# Patient Record
Sex: Female | Born: 1960 | Race: Black or African American | Hispanic: No | Smoking: Never smoker
Health system: Southern US, Community
[De-identification: ages and names within clinical notes are randomized; demographics above are authoritative.]

## PROBLEM LIST (undated history)

## (undated) DIAGNOSIS — F32A Depression, unspecified: Secondary | ICD-10-CM

## (undated) DIAGNOSIS — F419 Anxiety disorder, unspecified: Secondary | ICD-10-CM

## (undated) DIAGNOSIS — F329 Major depressive disorder, single episode, unspecified: Secondary | ICD-10-CM

## (undated) HISTORY — DX: Anxiety disorder, unspecified: F41.9

## (undated) HISTORY — DX: Depression, unspecified: F32.A

## (undated) HISTORY — DX: Major depressive disorder, single episode, unspecified: F32.9

---

## 2000-02-13 ENCOUNTER — Encounter: Payer: Self-pay | Admitting: Family Medicine

## 2000-02-13 ENCOUNTER — Ambulatory Visit (HOSPITAL_COMMUNITY): Admission: RE | Admit: 2000-02-13 | Discharge: 2000-02-13 | Payer: Self-pay | Admitting: Family Medicine

## 2001-01-12 ENCOUNTER — Inpatient Hospital Stay (HOSPITAL_COMMUNITY): Admission: RE | Admit: 2001-01-12 | Discharge: 2001-01-14 | Payer: Self-pay | Admitting: Obstetrics and Gynecology

## 2001-01-12 ENCOUNTER — Encounter (INDEPENDENT_AMBULATORY_CARE_PROVIDER_SITE_OTHER): Payer: Self-pay | Admitting: Specialist

## 2002-04-15 ENCOUNTER — Emergency Department (HOSPITAL_COMMUNITY): Admission: EM | Admit: 2002-04-15 | Discharge: 2002-04-15 | Payer: Self-pay | Admitting: Emergency Medicine

## 2003-04-08 ENCOUNTER — Ambulatory Visit (HOSPITAL_COMMUNITY): Admission: RE | Admit: 2003-04-08 | Discharge: 2003-04-08 | Payer: Self-pay | Admitting: Family Medicine

## 2003-08-11 ENCOUNTER — Ambulatory Visit (HOSPITAL_COMMUNITY): Admission: RE | Admit: 2003-08-11 | Discharge: 2003-08-11 | Payer: Self-pay | Admitting: General Surgery

## 2003-08-11 ENCOUNTER — Encounter (INDEPENDENT_AMBULATORY_CARE_PROVIDER_SITE_OTHER): Payer: Self-pay | Admitting: Specialist

## 2003-12-09 ENCOUNTER — Other Ambulatory Visit: Admission: RE | Admit: 2003-12-09 | Discharge: 2003-12-09 | Payer: Self-pay | Admitting: Family Medicine

## 2005-05-08 ENCOUNTER — Other Ambulatory Visit: Admission: RE | Admit: 2005-05-08 | Discharge: 2005-05-08 | Payer: Self-pay | Admitting: Family Medicine

## 2006-01-07 HISTORY — PX: COLOSTOMY REVERSAL: SHX5782

## 2006-01-07 HISTORY — PX: COLOSTOMY: SHX63

## 2006-01-07 HISTORY — PX: BUNIONECTOMY: SHX129

## 2006-06-03 ENCOUNTER — Inpatient Hospital Stay (HOSPITAL_COMMUNITY): Admission: EM | Admit: 2006-06-03 | Discharge: 2006-06-11 | Payer: Self-pay | Admitting: Emergency Medicine

## 2006-06-03 ENCOUNTER — Encounter (INDEPENDENT_AMBULATORY_CARE_PROVIDER_SITE_OTHER): Payer: Self-pay | Admitting: General Surgery

## 2006-09-23 ENCOUNTER — Inpatient Hospital Stay (HOSPITAL_COMMUNITY): Admission: RE | Admit: 2006-09-23 | Discharge: 2006-10-06 | Payer: Self-pay | Admitting: General Surgery

## 2006-09-23 ENCOUNTER — Encounter (INDEPENDENT_AMBULATORY_CARE_PROVIDER_SITE_OTHER): Payer: Self-pay | Admitting: General Surgery

## 2006-10-26 ENCOUNTER — Inpatient Hospital Stay (HOSPITAL_COMMUNITY): Admission: EM | Admit: 2006-10-26 | Discharge: 2006-10-30 | Payer: Self-pay | Admitting: Emergency Medicine

## 2008-05-11 ENCOUNTER — Other Ambulatory Visit: Admission: RE | Admit: 2008-05-11 | Discharge: 2008-05-11 | Payer: Self-pay | Admitting: Internal Medicine

## 2009-02-08 IMAGING — CT CT ABDOMEN W/ CM
2 of 5 series · 17 of 46 positions shown, 19 images · IV contrast (APPLIED)
Comparison: 10/02/2006

CLINICAL DATA: History of bowel perforation; subsequent colectomy/colostomy.  Revision. Popped incision site.  Back pain. 
ABDOMEN CT WITH CONTRAST:
TECHNIQUE: Multidetector CT imaging of the abdomen was performed following the standard protocol during bolus administration of intravenous contrast.
Contrast:  125 cc Omnipaque 300
TECHNIQUE: Multidetector CT imaging of the pelvis was performed following the standard protocol during bolus administration of intravenous contrast.

[Series 2: abd_pel 5.0 b40f st · axial · 0.58mm/px · z∈[-438,-58]mm · 14 of 86 slices shown, 16 images]
[im 5/86  soft-tissue]
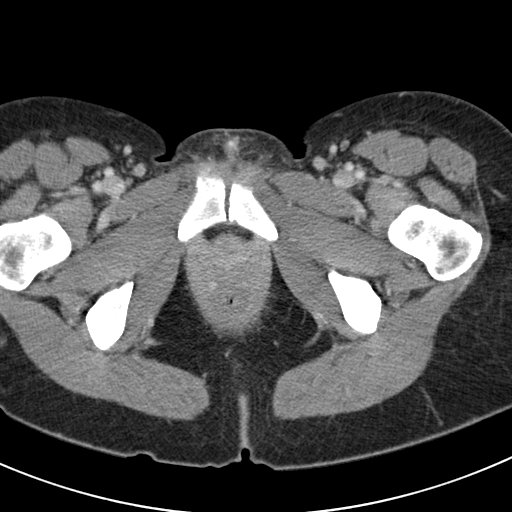
[im 5/86  bone]
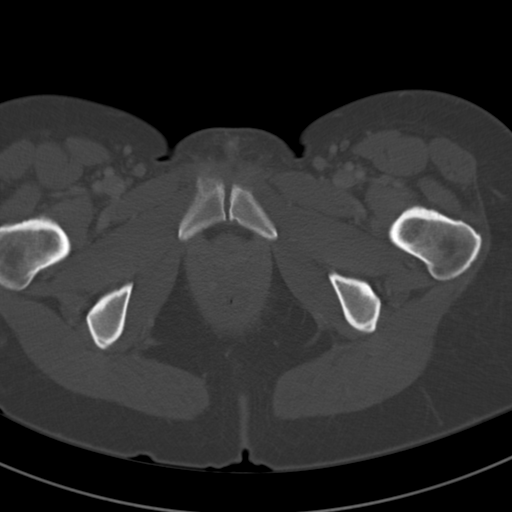
[im 10/86  soft-tissue]
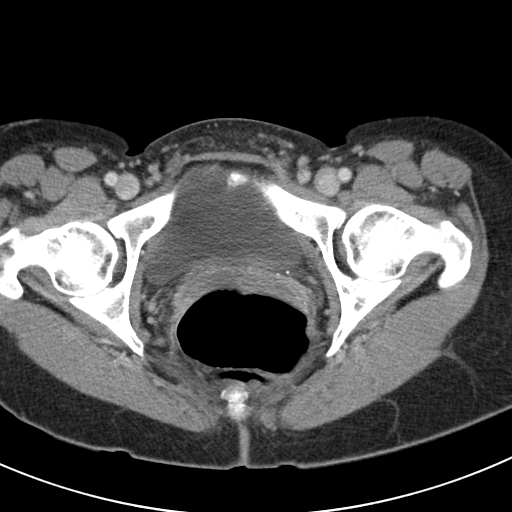
[im 19/86  soft-tissue]
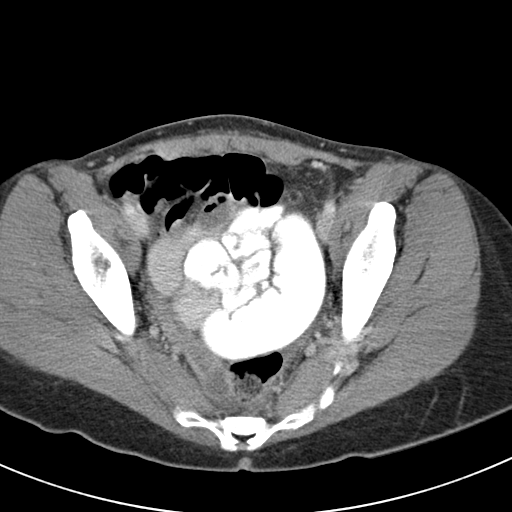
[im 24/86  soft-tissue]
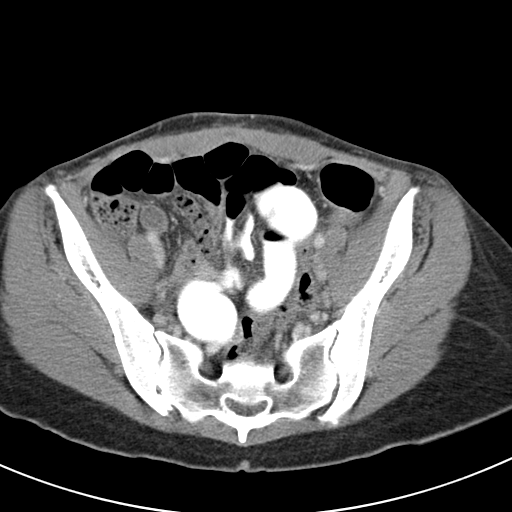
[im 29/86  soft-tissue]
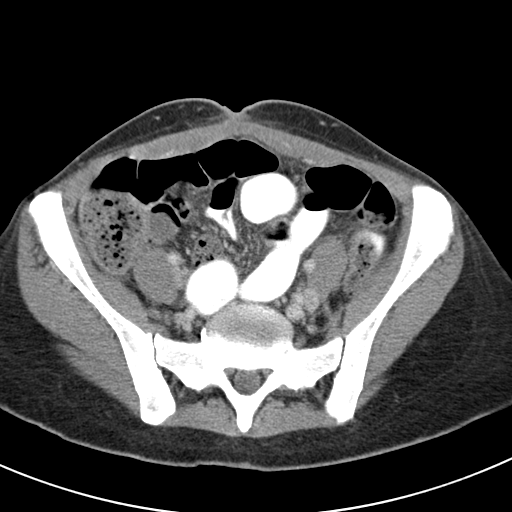
[im 34/86  soft-tissue]
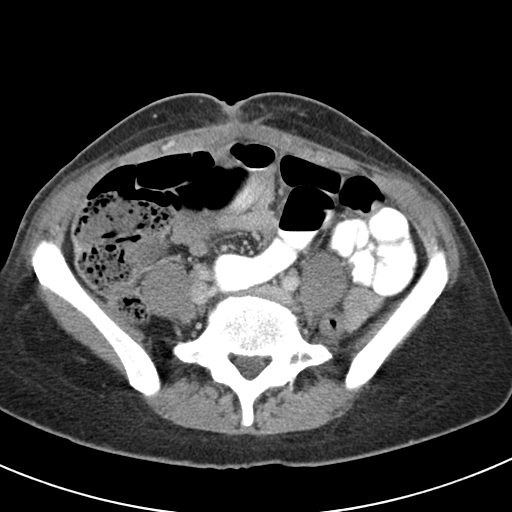
[im 38/86  soft-tissue]
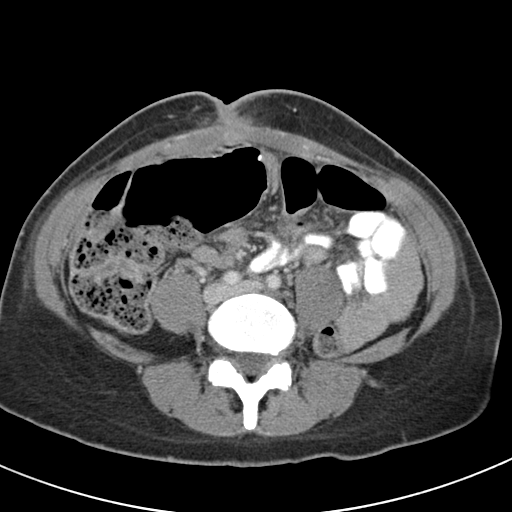
[im 48/86  soft-tissue]
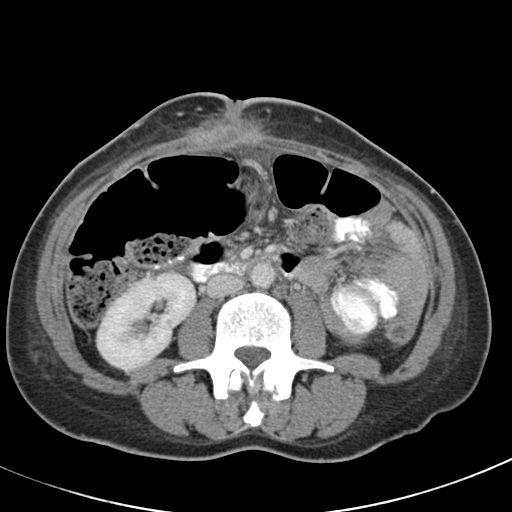
[im 52/86  soft-tissue]
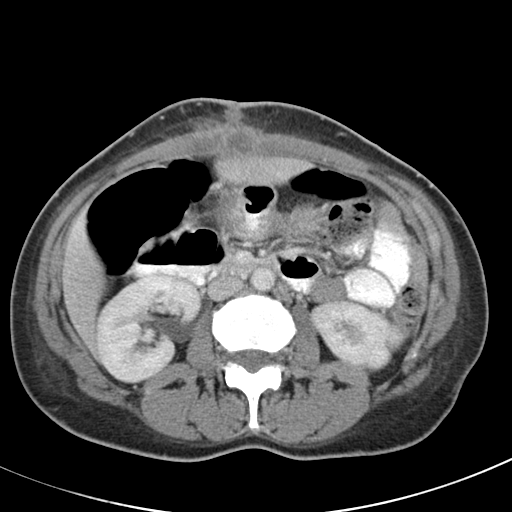
[im 52/86  bone]
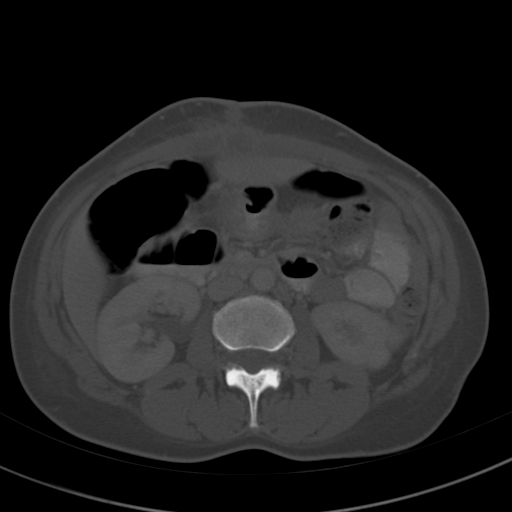
[im 57/86  soft-tissue]
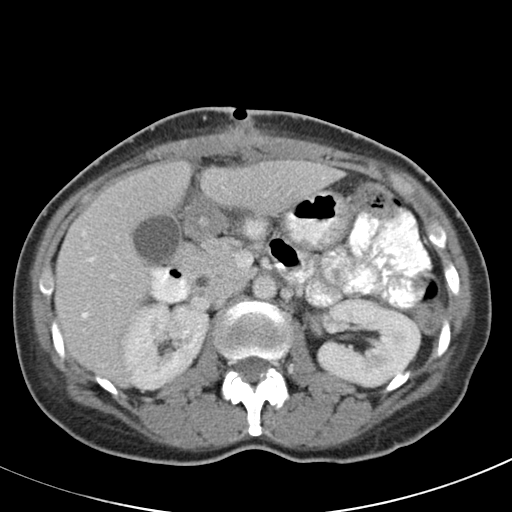
[im 62/86  soft-tissue]
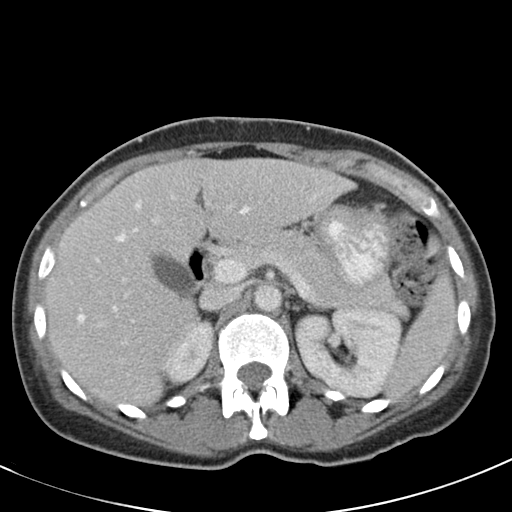
[im 67/86  soft-tissue]
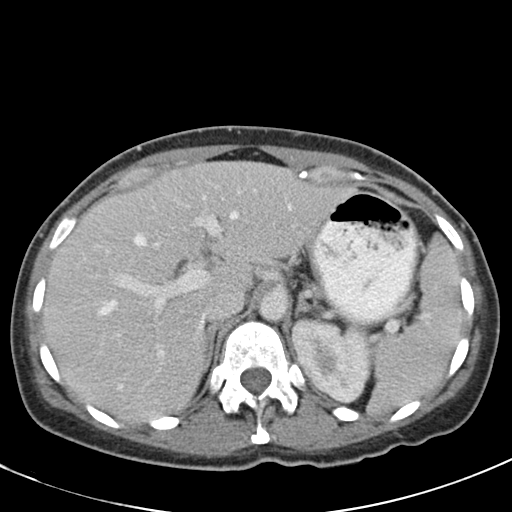
[im 76/86  soft-tissue]
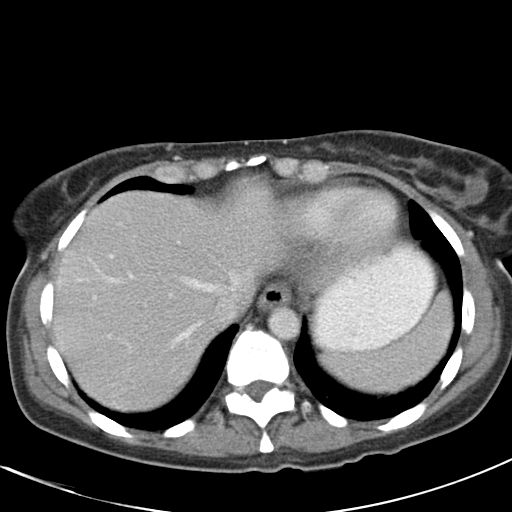
[im 81/86  soft-tissue]
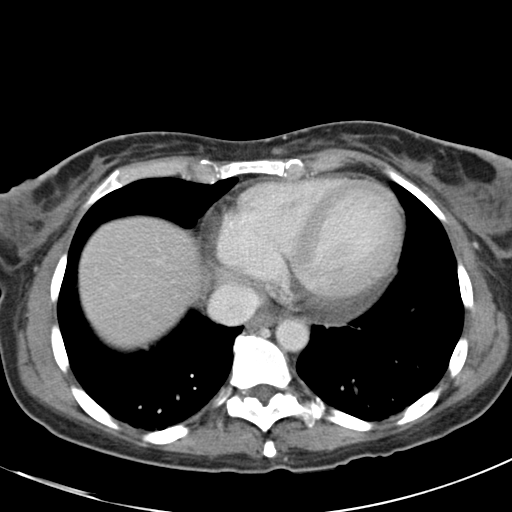

[Series 602: coronal abdomen · coronal · 0.87mm/px · 3 of 110 slices shown]
[im 37/110  soft-tissue]
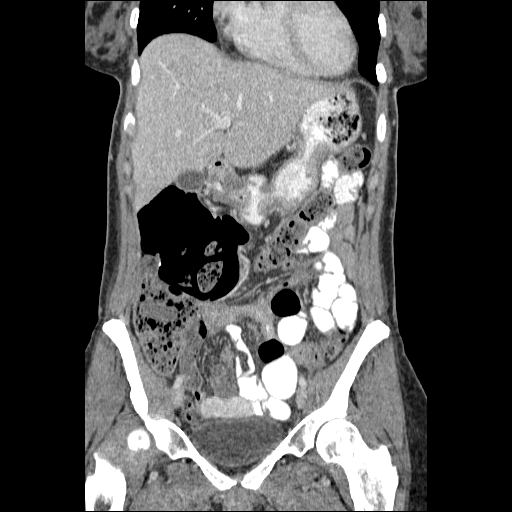
[im 49/110  soft-tissue]
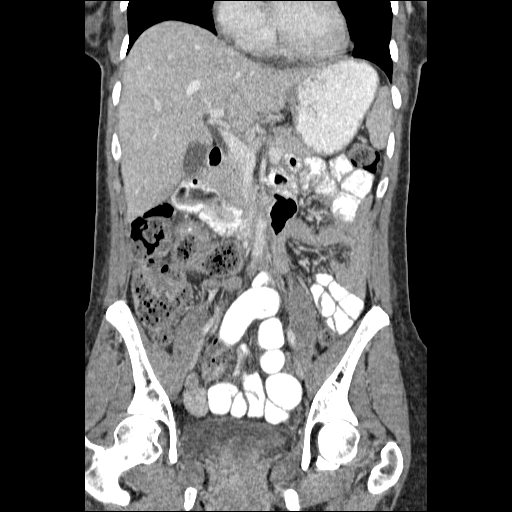
[im 61/110  soft-tissue]
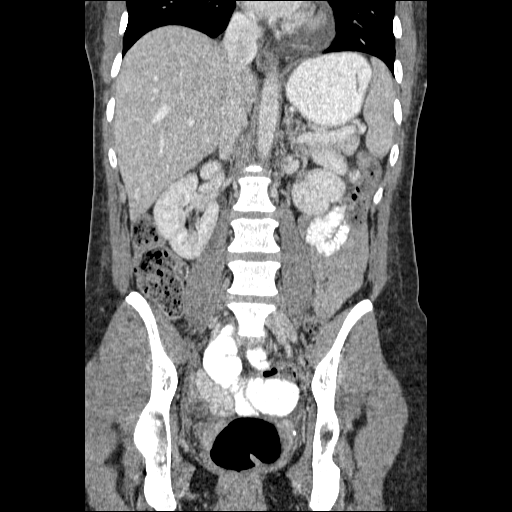

[17 of 46 positions shown; findings below may reference images not displayed]

FINDINGS: Since the prior examination, the anterior abdominal wall incision has partially healed by secondary intent.  There remains a fluid collection at the base of the incision in the region of the rectus muscle largest transverse dimension of 1.8 x 1.8 cm versus prior 1.6 x 1.3 cm. Remainder of findings in the upper abdomen appear relatively similar to prior exam.
IMPRESSION: Partial healing of anterior abdominal wall incision site.  There remains a focal fluid collection in the mid aspect of the rectus muscle at the base of the incision site now measuring 1.8 x 1.8 cm (series 2 image 34) which may be a small abscess.  Remainder of the findings relatively similar to prior exam. 
PELVIS CT WITH CONTRAST:
FINDINGS: Interval enlargement of complex fluid collection right aspect of the pelvis.  It is possible this represents the ovary however, I cannot confirm with certainty this is the ovary and a complex fluid collection can therefore not be excluded.  This spans over 6 x 3 cm.  This will require followup.
IMPRESSION: Progressive increase in size of complex fluid-appearing structure within right pelvis as well as small amount of free fluid.  This may represent enlargement of the right ovary measuring 6 x 3 cm.  This will require followup.

## 2010-05-22 NOTE — Op Note (Signed)
NAMEMarland Kitchen  BRITNAY, MAGNUSSEN              ACCOUNT NO.:  [id number]   MEDICAL RECORD NO.:  [id number]          PATIENT TYPE:  INP   LOCATION:  0098                         FACILITY:  [facility]   PHYSICIAN:  Ollen Gross. Vernell Morgans, M.D. DATE OF BIRTH:  1960-02-15   DATE OF PROCEDURE:  06/03/2006  DATE OF DISCHARGE:                               OPERATIVE REPORT   PREOPERATIVE DIAGNOSIS:  Free air.   POSTOPERATIVE DIAGNOSIS:  Perforated transverse colon.   PROCEDURES:  Exploratory laparotomy, lysis of adhesions, transverse  colectomy and ascending colostomy with Hartmann pouch.   SURGEON:  Ollen Gross. Vernell Morgans, M.D.   ASSISTANT:  Currie Paris, M.D.   ANESTHESIA:  General endotracheal.   PROCEDURE:  After informed consent was obtained, the patient was brought  to the operating room and placed in supine position on the table.  After  induction of general anesthesia, the patient's abdomen was prepped with  Betadine and draped in the usual sterile manner.  A midline incision was  made with #10 blade knife.  This incision was carried down through the  skin and subcutaneous tissue sharply with electrocautery until the linea  alba was identified.  The linea alba was also incised with the  electrocautery.  The preperitoneal space was probed bluntly with a  hemostat until the peritoneum was opened and access was gained to the  abdominal cavity.  The rest of the incision was opened under direct  vision with electrocautery.  The abdomen was inspected.  The stomach and  duodenum appeared normal.  There was a significant amount of purulent  material in the abdomen. The omentum was adhered down to the pelvis and  this was taken down sharply with the electrocautery.  Once this was  freed up and we were able to reflect a transverse colon superiorly, we  were able to find a perforation of the transverse colon. There did not  appear to be any mass associated with it, but just inflammatory change  there.  A  site was chosen proximal and distal to this site where the  bowel appeared to be healthy and the mesentery at these points were  opened sharply with electrocautery.  A GIA 75 stapler was placed across  the colon at each of these points, clamped and fired thereby dividing  the colon between staple lines.  The mesentery close to the bowel was  then taken down to the perforated area with the LigaSure and the  specimen was then removed and sent to pathology.  Several bleeders on  the staple lines were controlled with figure-of-eight 3-0 silk stitches.  The abdomen was then irrigated copious amounts of saline.  No other  abnormalities were noted on inspection of the intestines.  At this point  a site was chosen on the right abdomen wall for placement of the  ascending colostomy.  A circular piece of skin and subcutaneous fat was  excised sharply with electrocautery.  The fascia of the abdominal wall  was opened in a cruciate manner with electrocautery.  A Babcock was  placed through this opening and used to grasp  the staple line of the  colon.  The colon was brought through this opening without difficulty.  The colon was anchored on the inside to the abdominal wall with 2  interrupted 3-0 Vicryl stitches.  The position of the NG tube was  checked and was in good position.  At this point the fascia of the  anterior abdominal wall was closed with two running #1 double-stranded  loop PDS sutures.  Subcutaneous tissue was irrigated with saline and the  skin around the umbilicus was closed with staples.  The rest the wound  was left open and packed with moistened Kerlix and sterile dressings.  The staple line of the ostomy was then opened sharply with  electrocautery and the ostomy was matured with 3-0 Vicryl stitches.  An  ostomy bag was then applied.  The ostomy appeared pink and healthy and  was actually putting out stool at the end of case.  At the end of the  case all needle, sponge and  instrument counts were correct.  The patient  was then awakened and taken to recovery in stable condition.      Ollen Gross. Vernell Morgans, M.D.  Electronically Signed     PST/MEDQ  D:  06/03/2006  T:  06/03/2006  Job:  [id number]

## 2010-05-22 NOTE — Op Note (Signed)
NAME:  Linda Morrison, Linda Morrison              ACCOUNT NO.:  [id number]   MEDICAL RECORD NO.:  [id number]          PATIENT TYPE:  INP   LOCATION:  1533                         FACILITY:  [facility]   PHYSICIAN:  Ollen Gross. Vernell Morgans, M.D. DATE OF BIRTH:  [birth year]   DATE OF PROCEDURE:  09/23/2006  DATE OF DISCHARGE:                               OPERATIVE REPORT   PREOPERATIVE DIAGNOSIS:  Previous colostomy for perforated transverse  colon.   POSTOPERATIVE DIAGNOSIS:  Previous colostomy for perforated transverse  colon.   PROCEDURE:  Colostomy takedown, lysis of adhesions.   SURGEON:  Ollen Gross. Vernell Morgans, M.D.   ASSISTANT:  Leonie Man, M.D.   ANESTHESIA:  General endotracheal.   DESCRIPTION OF PROCEDURE:  After informed consent was obtained, the  patient was brought to the operating room and placed in the supine  position on the operating table.  After adequate induction of general  anesthesia, the patient's abdomen was prepped with Betadine and draped  in the usual sterile manner.  A midline incision was made with a 10  blade knife.  This was done around her old incision and her incision was  removed sharply with electrocautery.  Prior to starting the case, the  colostomy was closed with a 0 Vicryl pursestring stitch and this was  covered with a sterile green towel.  The midline incision was carried  down through the skin and subcutaneous tissue sharply with  electrocautery until the fascia of the midline was encountered.  This  was opened sharply with electrocautery.  The preperitoneal space was  probed bluntly with a hemostat until the peritoneum was opened and  access was gained to the abdominal cavity.  The rest of the incision was  opened under direct vision with the electrocautery. Some omental  adhesions to the anterior abdominal wall were taken down sharply with  electrocautery.   Once this was accomplished, the abdomen was able to be inspected.  There  were some interloop adhesions  that were also taken down sharply with  electrocautery.  Once this was accomplished, the abdomen appeared to be  pretty clean and free. The site of the ostomy was identified.  The  proximal end of the distal segment of transverse colon was also  identified easily in the middle of the wound and was very mobile.  At  this point, attention was then turned to the ostomy. The ostomy was  excised at the skin level with an ellipse of skin.  This was done  sharply with electrocautery.  The cautery was carried down through the  subcutaneous tissue.  The ostomy was separated from the rest of the  subcutaneous tissues and muscle of the abdominal wall by a combination  of sharp dissection with the cautery and blunt hemostat dissection.  Once this was done circumferentially, the ostomy was able to be brought  back into the abdomen through this opening without any difficulty.  The  last few cm of the proximal segment of colon were resected by clamping  the mesentery to this area with Kelly clamps, dividing and ligating the  vessels with 2-0 silk  ties.  The colon was then divided with a GIA-75  stapler at this point.   Next, the proximal and distal segments were easily able to approximate  each other.  They were held in place with interrupted 3-0 Vicryl  stitches on hemostats for retraction.  An enterotomy was made on the  antimesenteric border of each segment of colon near the staple lines.  Each limb of the GIA-75 stapler was then placed down the appropriate  limb of the colon, clamped and fired, thereby creating a nice widely  patent enteroenterostomy.  The common enterotomy was closed with a TA-30  stapler.  The staple line was then oversewed with 3-0 silk Lembert  stitches to dunk the staple line and a 3-0 silk crotch stitch was also  placed.  Once this was accomplished, the anastomosis appeared to be  widely patent and healthy.  It was placed back in the abdomen.  The  abdomen was then irrigated  copious amounts of saline.  The mesentery  defect was closed with interrupted figure-of-eight 3-0 silk stitches.  No other abnormalities were noted.   The old colostomy site was closed with interrupted the 0 Novofil  stitches.  The fascia of the anterior abdominal wall was closed with two  running #1 double stranded looped PDS stitches.  The subcutaneous tissue  was irrigated in both incisions with Betadine and saline and the skin  was closed with staples.  Betadine ointment and sterile dressings were  applied.  The patient tolerated the procedure well.  At the end of the  case, all needle, sponge, and instrument counts were correct.  The  patient was awakened and taken to the recovery room in stable condition.      Ollen Gross. Vernell Morgans, M.D.  Electronically Signed     PST/MEDQ  D:  09/23/2006  T:  09/23/2006  Job:  [id number]

## 2010-05-22 NOTE — H&P (Signed)
NAMEMarland Kitchen  BERNARD, DONAHOO              ACCOUNT NO.:  [id number]   MEDICAL RECORD NO.:  [id number]          PATIENT TYPE:  INP   LOCATION:  0098                         FACILITY:  [facility]   PHYSICIAN:  Ollen Gross. Vernell Morgans, M.D. DATE OF BIRTH:  1960-03-27   DATE OF ADMISSION:  06/02/2006  DATE OF DISCHARGE:                              HISTORY & PHYSICAL   Linda Morrison is a 50 year old white female who presented to the  emergency department with sudden onset of upper abdominal pain this  evening.  She has had some nausea and vomiting associated with it.  She  states that she recently had a neck injury and has been taking a lot of  pain pills for this.  She also has significant problems with  constipation.  She usually has a bowel movement about once a week.  She  has never had a pain like this before.  The rest of her review of  systems is unremarkable.   PAST MEDICAL HISTORY:  1. Significant for ectopic pregnancy.  2. Bechet's  disease.   PAST SURGICAL HISTORY:  Lower abdominal exploration for the ruptured  ectopic and tubal ligation.   MEDICATIONS:  Flexeril and Percocet.   ALLERGIES:  No known drug allergies.   SOCIAL HISTORY:  She denies use of tobacco and only occasionally drinks  any alcohol.   FAMILY HISTORY:  Noncontributory   PHYSICAL EXAMINATION:  VITAL SIGNS:  Her temperature is 98.5, blood  pressure 99/75, pulse 97.  GENERAL:  She is a well-developed, well-nourished white female in no  acute distress.  SKIN:  Warm and dry with no jaundice.  HEENT:  Extraocular muscles were intact. Sclerae nonicteric.  LUNGS:  Clear bilaterally with no use of accessory respiratory muscles.  HEART:  Regular rate and rhythm with impulse in the left chest.  ABDOMEN:  Diffusely tender with guarding.  EXTREMITIES:  No clubbing, cyanosis or edema with good strength in her  arms and legs.  PSYCHOLOGIC:  Alert and oriented x3 with no anxiety and depression  although she is tearful.   LABORATORY DATA:  Her labs were reviewed and were significant for a  white count of 12,800.  Her abdominal and chest x-rays were reviewed and  were significant for free air into the diaphragm.   ASSESSMENT/PLAN:  1. This is a 50 year old white female with free intra-abdominal air      consistent with perforation of the intestine.  I think most likely      place would be a perforated duodenal ulcer although a colonic      perforation is certainly not excluded.  2. There is the risk of overwhelming sepsis.  I think she needs to be      explored tonight to repair the perforation.  I      have explained to her in detail the risks and benefits of the      operation to fix these perforations as well as some of the      technical aspects including the possibility for an ostomy.  She      understands and wishes to proceed.  We plan to do this urgently      this evening.      Ollen Gross. Vernell Morgans, M.D.  Electronically Signed     PST/MEDQ  D:  06/03/2006  T:  06/03/2006  Job:  [id number]

## 2010-05-22 NOTE — H&P (Signed)
NAME:  Linda Morrison, Linda Morrison              ACCOUNT NO.:  [id number]   MEDICAL RECORD NO.:  [id number]          PATIENT TYPE:  INP   LOCATION:  1539                         FACILITY:  [facility]   PHYSICIAN:  Thornton Park. Daphine Deutscher, MD  DATE OF BIRTH:  1960-12-02   DATE OF ADMISSION:  10/26/2006  DATE OF DISCHARGE:                              HISTORY & PHYSICAL   CHIEF COMPLAINT:  Bloating and abdominal discomfort.   HISTORY:  This is a 50 year old American female with spontaneous  perforation of her transverse colon in May 2008 treated by Dr. Carolynne Edouard with  resection and proximal colostomy.  She came in, in September 2008,  approximately one month ago and had a colostomy takedown.  She has had  an infected wound treated in the office since then, and was feeling like  she was tearing and draining and saw Dr. Carolynne Edouard on Friday.  She said she  has had some increasing abdominal swelling and feeling something is  torn, and we came to the emergency department last evening where upon a  CT scan was performed which showed an undrained fluid collection in her  pelvis as well as you could see the area of the upper abdominal wound  where it was draining.   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  She is currently not taking any prednisone or medicines  for that.   PRIOR SURGERY:  TAH-BSO in 2005.   ALLERGIES:  NO KNOWN ALLERGIES.   HABITS:  Nonsmoker.   REVIEW OF SYSTEMS:  Noncontributory.   PHYSICAL EXAMINATION:  VITAL SIGNS:  afebrile, 120/72 blood pressure,  pulse 95, respirations 18.  HEENT:  Head: Normocephalic.  Eyes: Sclerae nonicteric.  Pupils equal,  round and react to light.  Nose and throat exam:  No oral mucosal  lesions noted.  NECK:  Supple without adenopathy.  CHEST:  Clear to auscultation.  HEART:  Sinus rhythm without murmurs or gallops.  ABDOMEN:  There is a midline incision of at least three little areas  that are open with yellow, purulent  material that is non-foul-smelling  draining.  These were probed.  CHEST:  She has bowel sounds and passing some gas.  PELVIC:  Exam was not performed.  EXTREMITIES:  Full range of motion without cyanosis, edema or clubbing.  SKIN: No unusual lesions.  NEUROLOGICAL:  Alert, oriented x3.  Motor and sensory function grossly  intact.   STUDIES:  CT scan:  Pelvic collection undrained in the bottom.   PLAN:  I discussed trying to manager her as an outpatient, but since she  has ready been back couple of times, plan to admit her, start her on IV  Mefoxin and __________ CT aspiration, possible drainage of the pelvic  collection.      Thornton Park Daphine Deutscher, MD  Electronically Signed     MBM/MEDQ  D:  10/26/2006  T:  10/27/2006  Job:  [id number]   cc:   Brooke Bonito, M.D.  Fax: [phone]

## 2010-05-25 NOTE — Op Note (Signed)
[facility]  Patient:    Linda Morrison, Linda Morrison Visit Number: [id number] MRN: [id number]          Service Type: GYN Location: [id number] Attending Physician:  Frederich Balding Dictated by:   Juluis Mire, M.D. Proc. Date: 01/12/01 Admit Date:  01/12/2001                             Operative Report  PREOPERATIVE DIAGNOSIS:       Uterine fibroids.  POSTOPERATIVE DIAGNOSIS:      Uterine fibroids.  OPERATION:                    Total abdominal hysterectomy.  SURGEON:                      Juluis Mire, M.D.  ASSISTANT:                    Guy Sandifer. Arleta Creek, M.D.  ANESTHESIA:                   General endotracheal.  ESTIMATED BLOOD LOSS:         200 cc.  PACKS AND DRAINS:             Were none.  INTRAOPERATIVE BLOOD REPLACEMENT:                  None.  COMPLICATIONS:                None.  INDICATIONS:                  Are as noted in the History and Physical.  DESCRIPTION OF PROCEDURE:     The patient was taken to the OR and placed in the supine position.  After a satisfactory level of general endotracheal anesthesia was obtained, the abdomen, perineum, and vagina were prepped out with Betadine, and draped in the sterile field.  A prior low transverse skin incision was identified and excised.  The incision was extended through the subcutaneous tissue.  The anterior rectus fascia was entered sharply.  The incision in the fascia was then extended laterally.  The fascia was taken off the muscle superiorly and inferiorly.  The rectus muscles were separated in the midline.  The anterior peritoneum was then entered, and the incision in the peritoneum was extended both superiorly and inferiorly.  She did have some omental adhesions to the lower abdomen.  These were easily taken down using a bipolar.  No active bleeding was noted.  Palpation of the upper abdomen revealed both kidneys to be of normal size and shape.  The appendix  was visualized and noted to be unremarkable.  An OConnor-OSullivan retractor was put in place and the bowel contents were packed superiorly out of the pelvic cavity.  Rolled towels were used to keep the retractor elevated off the posterolateral walls.  The uterus was then elevated through the incision.  The left tube and ovary were surgically absent.  The right tube and ovary were unremarkable.  The uterus was enlarged with multiple fibroids.  The right round ligament was clamped, cut, and suture ligated with 0 Vicryl.  The right utero-ovarian pedicle was isolated, clamped, and cut.  The pedicle was then secured with a free tie of 0 Vicryl, and then a suture ligature of 0 Vicryl. A bladder flap was then developed.  The right uterine vessels were clamped, cut, and suture ligated with 0 Vicryl.  The left tube and ovary were surgically absent, so the remaining broad ligament was clamped and cut, and suture ligated with 0 Vicryl.  The bladder flap was then made and the bladder flap was developed.  The left uterine vessels were clamped, cut, and suture ligated with 0 Vicryl.  Next, using the clamp, cut, and tie technique with suture ligature of 0 Vicryl, the parametrium was serially separated from the side of the uterus. Both vaginal angles were clamped, cut, and the intervening vaginal mucosa was excised.  The uterus was then passed off the operative field.  Vaginal angles were secured with suture ligatures of 0 Vicryl.  The intervening vaginal mucosa was then closed with a running suture of 0 Vicryl.  The right ovary was then elevated to the right round ligament with a suture ligature of 0 Vicryl. Uterosacral ligaments were brought together in the midline.  We had excellent hemostasis and the pelvic cavity was thoroughly irrigated.  Urine output was clear and adequate.  The packs were removed along with the OConnor-OSullivan retractor.  The peritoneum was closed with a running suture of 0  Vicryl.  The fascia was closed with a running suture of 0 PDS.  The skin was closed with staples and Steri-Strips.  Sponge, instrument, and needle count was reported as correct by the circulating nurse x 2.  Urine output remained clear at the time of closure. The patient tolerated the procedure well and once extubated transferred to the recovery room in good condition. Dictated by:   Juluis Mire, M.D. Attending Physician:  Frederich Balding DD:  01/12/01 TD:  01/12/01 Job: [id number]

## 2010-05-25 NOTE — Discharge Summary (Signed)
NAME:  ALEKA, TWITTY              ACCOUNT NO.:  [id number]   MEDICAL RECORD NO.:  [id number]          PATIENT TYPE:  INP   LOCATION:  1527                         FACILITY:  [facility]   PHYSICIAN:  Ollen Gross. Vernell Morgans, M.D. DATE OF BIRTH:  31-Jul-1960   DATE OF ADMISSION:  06/02/2006  DATE OF DISCHARGE:  06/11/2006                               DISCHARGE SUMMARY   Ms. Montel is a 50 year old black female who was admitted with  abdominal pain and elevated white count.  X-rays done in the emergency  department showed free intra-abdominal air consistent with a bowel  perforation.  She was taken to the operating room on that day and was  explored.  She was found to have a perforation of her proximal  transverse colon.  This was resected, and her abdomen was washed out.  She was given an ascending colostomy.  She was started Invanz for  antibiotic coverage, and this was continued for about a week.  Postoperatively she had an NG tube and was on bowel rest.  The ostomy  nurses were consulted for teaching and helping her care for ostomy.  On  day #3, her Foley catheter was discontinued.  She was up ambulating.  She continued to slowly improve.  Her NG tube was clamped on day #4, and  she tolerated this.  During her hospital stay, we also obtained an  orthopedics consult for some pain she was having in her left shoulder  that was chronic in nature and was occurring before her surgery.  Her NG  tube was discontinued on June 08, 2006.  Diet was slowly advanced.  She  continued to do well, and by June 11, 2006 she was tolerating her diet.  Home health nursing had been consulted to help her with dressing  changes, as her skin wound was left open.   DIET:  As tolerated.   ACTIVITIES:  No heavy lifting.   FINAL DIAGNOSIS:  Transverse colon perforation.   FOLLOW UP:  Followup will be with Dr. Carolynne Edouard in the next couple of weeks,  and she is ready for discharge home.   CONDITION ON DISCHARGE:   Stable.   DISCHARGE MEDICATIONS:  She was to resume her home medications and was  given a prescription for Percocet for pain.      Ollen Gross. Vernell Morgans, M.D.  Electronically Signed     PST/MEDQ  D:  06/27/2006  T:  06/27/2006  Job:  [id number]

## 2010-05-25 NOTE — Op Note (Signed)
NAME:  Linda Morrison, LEVELS                        ACCOUNT NO.:  [id number]   MEDICAL RECORD NO.:  [id number]                   PATIENT TYPE:  AMB   LOCATION:  DAY                                  FACILITY:  [facility]   PHYSICIAN:  Timothy E. Earlene Plater, M.D.              DATE OF BIRTH:  04/15/1960   DATE OF PROCEDURE:  08/11/2003  DATE OF DISCHARGE:                                 OPERATIVE REPORT   PREOPERATIVE DIAGNOSIS:  Anal polyp.   POSTOPERATIVE DIAGNOSIS:  Anal polyp.   PROCEDURE:  Excision of anal polyp.   SURGEON:  Timothy E. Earlene Plater, M.D.   ANESTHESIA:  General.   INDICATIONS FOR PROCEDURE:  Ms. Crossman is 3, otherwise healthy, has  history of prolapsing anal polyp.  She was seen by Dr. Jarold Motto and  referred for excision.  This was discussed, and she agrees to this  procedure.  She was seen and identified and the permit signed.   She was taken to the operating room and placed supine.  LMA anesthesia  provided.  She was placed in lithotomy. perianal area inspected, prepped,  and draped in the usual fashion.  Anoscopy carried out showing only a  prolapsing left lateral anal polyp.  The area was anesthetized with 0.25%  Marcaine with epinephrine mixed 9:1 with Wydase.  This was massaged in well.  The polyp was clamped at its base, excised, and the base oversewn with a 3-0  chromic.  There was no bleeding or complication.  The procedure was  complete.  Gelfoam, gauze, and a dry sterile dressing applied.  She  tolerated it well, was removed to the recovery room in good condition.   Written and verbal instructions were given including Vicodin #24, and she  will be followed in the office.                                               Timothy E. Earlene Plater, M.D.    TED/MEDQ  D:  08/11/2003  T:  08/11/2003  Job:  [id number]   cc:   Vania Rea. Jarold Motto, M.D. [facility]   Meredith Staggers, M.D.  [address]  [place]  [place] [address]  Fax: [phone]

## 2010-05-25 NOTE — Discharge Summary (Signed)
NAME:  BRIAN, KOCOUREK              ACCOUNT NO.:  [id number]   MEDICAL RECORD NO.:  [id number]          PATIENT TYPE:  INP   LOCATION:  1539                         FACILITY:  [facility]   PHYSICIAN:  Ollen Gross. Vernell Morgans, M.D. DATE OF BIRTH:  09/02/1960   DATE OF ADMISSION:  10/25/2006  DATE OF DISCHARGE:  10/30/2006                               DISCHARGE SUMMARY   HOSPITAL COURSE:  Ms. Simoneau is a 50 year old black female who had  recently undergone colostomy takedown.  She was then readmitted on  October 19 with some abdominal pain.  She underwent a CT scan that  showed some fluid down in pelvis, but on further review this fluid was  there before, and may have been an ovary.  There was no other abscess  noted.  She was noted to have a lot of stool throughout her colon.  She  was started on pain medicine as well as some MiraLax, and she gradually  improved to the point where her bowels started to move, much more  regularly.  Her pain resolved, and on October 23 she was ready for  discharge home.   MEDICINES:  She was to resume her home medicines.  She was also asked to  take MiraLax for constipation.   ACTIVITIES:  No heavy lifting.   DIET:  As tolerated.   FINAL DIAGNOSIS:  Recent colostomy takedown with constipation.   FOLLOWUP:  With Dr. Carolynne Edouard in 2 weeks, and she is discharged home.      Ollen Gross. Vernell Morgans, M.D.  Electronically Signed     PST/MEDQ  D:  11/25/2006  T:  11/26/2006  Job:  [id number]

## 2010-05-25 NOTE — Discharge Summary (Signed)
[facility]  Patient:    Linda Morrison, Linda Morrison Visit Number: [id number] MRN: [id number]          Service Type: GYN Location: 9300 [id number] Attending Physician:  Frederich Balding Dictated by:   Juluis Mire, M.D. Admit Date:  01/12/2001 Discharge Date: 01/14/2001                             Discharge Summary  ADMISSION DIAGNOSIS:  Uterine fibroids.  DISCHARGE DIAGNOSIS:  Uterine fibroids.  OPERATION:  Total abdominal hysterectomy.  For complete history and physical, please see the dictated note.  HOSPITAL COURSE:  The patient underwent the above noted surgery.  Pathology revealed a 322 g uterus with multiple fibroids, all of which were indeed benign.  There was also some adenomyosis.  Postoperatively, the patient did quite well.  Her postoperative hemoglobin was 10.6.  She was discharged home on her second postoperative day.  At that time, she was afebrile with stable vital signs.  She was tolerating a regular diet and ambulating without difficulty.  Bowel and bladder function were normal.  She had no active vaginal bleeding, and the incision was intact.  In terms of complications, none were encountered during her stay in the hospital.  CONDITION ON DISCHARGE:  The patient is discharged home in stable condition.  DISPOSITION:  Routine postoperative instructions were given.  ACTIVITY:  She is to avoid heavy lifting, vaginal entrance, or driving a car.  WOUND CARE:  She is to watch for signs of infection, nausea, vomiting, increase in abdominal pain, or active vaginal bleeding.  DISCHARGE MEDICATIONS: 1. Tylox. 2. Motrin.  FOLLOWUP:  We will reevaluate in the office on Friday.  Remove staples. Dictated by:   Juluis Mire, M.D. Attending Physician:  Frederich Balding DD:  01/14/01 TD:  01/14/01 Job: [id number]

## 2010-05-25 NOTE — Discharge Summary (Signed)
NAME:  ADEN, YOUNGMAN              ACCOUNT NO.:  [id number]   MEDICAL RECORD NO.:  [id number]          PATIENT TYPE:  INP   LOCATION:  1533                         FACILITY:  [facility]   PHYSICIAN:  Ollen Gross. Vernell Morgans, M.D. DATE OF BIRTH:  05-07-1960   DATE OF ADMISSION:  09/23/2006  DATE OF DISCHARGE:  10/06/2006                               DISCHARGE SUMMARY   HOSPITAL COURSE:  Ms. Kanitz is a 50 year old black female who had a  history of colon perforation.  She had a colostomy placed.  She was  brought back to the operating room on September 23, 2006 for a colostomy  takedown.  She tolerated the surgery well.  Postoperatively, she had a  prolonged ileus.  The NG tube was left in for several days.  She was  ambulating on the first postoperative day without difficulty.  She did  run some fevers postoperatively and ended up developing a wound  infection.  She had to have her staples removed and the wound opened.  Her fascia was intact.  She was started on dressing changes and the  wound gradually cleaned up.  She was started on clear liquids and then  diet was gradually advanced.  By October 06, 2006, she was tolerating  her diet.  She was having bowel movements.  Home health was arranged to  help her with dressing changes and she was doing well.  At that point  was ready for discharge home.   DISCHARGE MEDICATIONS:  1. She was to resume her home meds.  2. She was given a prescription for pain medicine.   ACTIVITY:  No heavy lifting.   DIET:  As tolerated.   FINAL DIAGNOSIS:  Colostomy takedown.   FOLLOW UP:  Dr. Carolynne Edouard in a week for wound check.   DISPOSITION:  She is discharged home.      Ollen Gross. Vernell Morgans, M.D.  Electronically Signed     PST/MEDQ  D:  11/05/2006  T:  11/05/2006  Job:  [id number]

## 2010-05-25 NOTE — H&P (Signed)
[facility]  Patient:    Linda Morrison, Linda Morrison Visit Number: [id number] MRN: [id number]          Service Type: GYN Location: 9300 [id number] [id number] Attending Physician:  Frederich Balding Dictated by:   Juluis Mire, M.D. Admit Date:  01/12/2001                           History and Physical  CHIEF COMPLAINT:              The patient is a 50 year old gravida 4, para 1, abortus 3 black female who presents for total abdominal hysterectomy for management of uterine fibroids.  HISTORY OF PRESENT ILLNESS:   The patient has been followed with uterine fibroids.  These have been relatively symptomatic.  Beginning this year, she began having increasing pain and discomfort.  She reports that her cycles were regular.  They would last approximately seven days total.  She had increasing pain and discomfort with her cycles, particularly in the right lower quadrant. It was also noted during intercourse that she having increasing discomfort during deep penetration.  On exam, it was found that the cervix was deviated to the left.  The uterus was enlarged.  It was approximately 10-12 weeks and there was a left-sided uterine fibroid measuring approximately 4-5 cm.  A right-sided uterine fibroid was 6-7 cm, and this was a definite enlargement. We did perform and ultrasound, which confirmed our findings.  Because of continued pain and discomfort and more bleeding, the patient now presents for definitive therapy in the form of total abdominal hysterectomy.  Other options were discussed and these did include attempts at cycling with low dose birth control pills, radiologic embolization or myomectomy.  The patient is in favor of definitive therapy.  It is of note that she has had a previous ruptured left ectopic with left salpingo-oophorectomy.  The right tube was only partially functional, also.  She has been evaluated for possible in vitro fertilization, which she does  decline.  ALLERGIES:                    No known drug allergies.  MEDICATIONS:                  None at the present time.  PAST MEDICAL HISTORY:         History of Behcets  syndrome with resultant vaginal alterations, requiring treatments with prednisone.  PAST SURGICAL HISTORY:        She has had the previous exploratory laparotomy with left salpingo-oophorectomy for management of ectopic pregnancy.  This was back in 1997.  Otherwise, she has had one spontaneous vaginal delivery and otherwise abortions.  FAMILY HISTORY:               Basically noncontributory.  SOCIAL HISTORY:               No tobacco or alcohol use.  REVIEW OF SYSTEMS:            Noncontributory.  PHYSICAL EXAMINATION:  VITAL SIGNS:                  Afebrile with stable vital signs.  HEENT:                        Normocephalic.  Pupils equal, round and reactive to light and accommodation.  Extraocular movements intact.  Sclerae and conjunctivae  clear.  Oropharynx clear.  NECK:                         Without thyromegaly.  BREASTS:                      No discrete masses.  LUNGS:                        Clear.  CARDIAC:                      Regular rate.  No murmurs or gallops.  ABDOMEN:                      Benign.  PELVIC:                       Normal external genitalia.  The vaginal mucosa is clear.  The cervix is unremarkable.  The uterus is markedly enlarged, 10-12 weeks in size.  Adnexa unremarkable.  EXTREMITIES:                  Trace edema.  NEUROLOGIC:                   Grossly within normal limits.  IMPRESSION:                   Enlarging uterine fibroids with associated symptomatology.  PLAN:                         The patient will undergo total abdominal hysterectomy.  The risks of surgery have been discussed including the risk of anesthesia; the risk of infection; the risk of hemorrhage that could require transfusion with the risk of AIDS or hepatitis; the risk of injury to  adjacent organs including bladder, bowel or ureters that could require further exploratory surgery; the risk of deep venous thrombosis and pulmonary embolus.  The patient expressed and understanding of the indications and risks. Dictated by:   Juluis Mire, M.D. Attending Physician:  Frederich Balding DD:  01/12/01 TD:  01/12/01 Job: [id number]

## 2010-10-17 LAB — WOUND CULTURE

## 2010-10-17 LAB — BASIC METABOLIC PANEL
BUN: 8
CO2: 29
Chloride: 98
Creatinine, Ser: 0.73
GFR calc non Af Amer: >60
Sodium: 132 — ABNORMAL LOW

## 2010-10-17 LAB — CBC
HCT: 28.3 — ABNORMAL LOW
HCT: 28.4 — ABNORMAL LOW
Hemoglobin: 9.6 — ABNORMAL LOW
MCHC: 33.5
MCHC: 34
MCV: 90.2
Platelets: 394
Platelets: 427 — ABNORMAL HIGH

## 2010-10-17 LAB — COMPREHENSIVE METABOLIC PANEL
Alkaline Phosphatase: 56
BUN: 12
CO2: 30
Chloride: 102
GFR calc non Af Amer: >60
Potassium: 4.3
Sodium: 137

## 2010-10-17 LAB — DIFFERENTIAL
Basophils Absolute: 0
Basophils Absolute: 0
Eosinophils Absolute: 0.2
Lymphocytes Relative: 12
Lymphocytes Relative: 19
Lymphs Abs: 2.1
Monocytes Absolute: 0.4
Neutro Abs: 7
Neutrophils Relative %: 73

## 2010-10-18 LAB — BASIC METABOLIC PANEL
BUN: 1 — ABNORMAL LOW
BUN: 3 — ABNORMAL LOW
CO2: 24
CO2: 26
Calcium: 8.4
Calcium: 8.5
Creatinine, Ser: 0.74
GFR calc Af Amer: >60
GFR calc non Af Amer: >60
GFR calc non Af Amer: >60
GFR calc non Af Amer: >60
Glucose, Bld: 127 — ABNORMAL HIGH
Glucose, Bld: 159 — ABNORMAL HIGH
Potassium: 3.6
Potassium: 4
Potassium: 4.3
Sodium: 131 — ABNORMAL LOW
Sodium: 138

## 2010-10-18 LAB — DIFFERENTIAL
Basophils Absolute: 0
Basophils Absolute: 0.1
Eosinophils Absolute: 0
Eosinophils Relative: 0
Eosinophils Relative: 1
Lymphocytes Relative: 12
Lymphocytes Relative: 6 — ABNORMAL LOW
Lymphs Abs: 0.6 — ABNORMAL LOW
Lymphs Abs: 0.8
Lymphs Abs: 1
Monocytes Absolute: 0.4
Monocytes Absolute: 0.6
Neutro Abs: 3.6
Neutro Abs: 6.4
Neutrophils Relative %: 63
Neutrophils Relative %: 85 — ABNORMAL HIGH

## 2010-10-18 LAB — CBC
HCT: 27.7 — ABNORMAL LOW
HCT: 28.8 — ABNORMAL LOW
HCT: 30.6 — ABNORMAL LOW
Hemoglobin: 10.4 — ABNORMAL LOW
MCHC: 34.5
MCV: 93.3
MCV: 93.8
MCV: 94.7
MCV: 95
Platelets: 126 — ABNORMAL LOW
Platelets: 131 — ABNORMAL LOW
Platelets: 133 — ABNORMAL LOW
Platelets: 150
Platelets: 179
Platelets: 207
RBC: 3.23 — ABNORMAL LOW
RBC: 3.57 — ABNORMAL LOW
RBC: 3.83 — ABNORMAL LOW
RDW: 12.6
RDW: 12.7
RDW: 12.8
RDW: 13
RDW: 13.2
WBC: 12.7 — ABNORMAL HIGH
WBC: 5.8
WBC: 8.2
WBC: 8.4

## 2010-10-18 LAB — URINE CULTURE
Culture: NO GROWTH
Special Requests: NEGATIVE

## 2010-10-18 LAB — URINALYSIS, ROUTINE W REFLEX MICROSCOPIC
Glucose, UA: NEGATIVE
Leukocytes, UA: NEGATIVE
Leukocytes, UA: NEGATIVE
Nitrite: NEGATIVE
Specific Gravity, Urine: 1.012
Specific Gravity, Urine: 1.019
pH: 6.5
pH: 6.5

## 2010-10-18 LAB — URINE MICROSCOPIC-ADD ON

## 2010-10-19 LAB — BASIC METABOLIC PANEL
Calcium: 9.6
GFR calc non Af Amer: >60
Potassium: 4.9
Sodium: 139

## 2010-10-19 LAB — DIFFERENTIAL
Eosinophils Relative: 3
Lymphocytes Relative: 45
Lymphs Abs: 2.6
Neutro Abs: 2.6

## 2010-10-19 LAB — CBC
HCT: 40.4
Hemoglobin: 13.8
Platelets: 271
WBC: 5.8

## 2010-10-25 LAB — CBC
HCT: 31 — ABNORMAL LOW
Hemoglobin: 10.6 — ABNORMAL LOW
MCHC: 34.3
MCV: 92.5
RBC: 3.35 — ABNORMAL LOW
WBC: 4

## 2010-10-25 LAB — DIFFERENTIAL
Basophils Relative: 0
Eosinophils Absolute: 0
Eosinophils Relative: 1
Lymphs Abs: 0.6 — ABNORMAL LOW
Monocytes Absolute: 0.3
Monocytes Relative: 7
Neutrophils Relative %: 78 — ABNORMAL HIGH

## 2010-10-25 LAB — BASIC METABOLIC PANEL
CO2: 27
Chloride: 104
Creatinine, Ser: 0.57
GFR calc Af Amer: >60
Potassium: 4.2

## 2010-11-23 ENCOUNTER — Other Ambulatory Visit: Payer: Self-pay | Admitting: Specialist

## 2011-12-09 ENCOUNTER — Other Ambulatory Visit: Payer: Self-pay | Admitting: Internal Medicine

## 2011-12-09 ENCOUNTER — Other Ambulatory Visit (HOSPITAL_COMMUNITY)
Admission: RE | Admit: 2011-12-09 | Discharge: 2011-12-09 | Disposition: A | Discharge status: Home / Self Care | Payer: Managed Care, Other (non HMO) | Source: Ambulatory Visit | Attending: Internal Medicine | Admitting: Internal Medicine

## 2011-12-09 DIAGNOSIS — Z01419 Encounter for gynecological examination (general) (routine) without abnormal findings: Secondary | ICD-10-CM | POA: Insufficient documentation

## 2012-08-18 ENCOUNTER — Encounter: Payer: Self-pay | Admitting: Gastroenterology

## 2012-08-28 ENCOUNTER — Ambulatory Visit: Payer: Self-pay | Admitting: Podiatry

## 2012-10-08 ENCOUNTER — Telehealth: Payer: Self-pay | Admitting: *Deleted

## 2012-10-08 NOTE — Telephone Encounter (Signed)
Called and left pt message at 5:02pm that colonoscopy appointment had been cancelled and she would need to call back in and reschedule her colonoscopy and a pre-visit since she missed her 4:00 pm pre-visit appointment today.-adm

## 2012-10-15 ENCOUNTER — Ambulatory Visit (AMBULATORY_SURGERY_CENTER): Payer: Self-pay | Admitting: *Deleted

## 2012-10-15 VITALS — Ht 64.5 in | Wt 158.2 lb

## 2012-10-15 DIAGNOSIS — Z1211 Encounter for screening for malignant neoplasm of colon: Secondary | ICD-10-CM

## 2012-10-15 MED ORDER — MOVIPREP 100 G PO SOLR: ORAL | Status: DC

## 2012-10-16 ENCOUNTER — Encounter: Payer: Self-pay | Admitting: Gastroenterology

## 2012-10-21 ENCOUNTER — Encounter: Payer: Self-pay | Admitting: Gastroenterology

## 2012-10-21 ENCOUNTER — Ambulatory Visit (AMBULATORY_SURGERY_CENTER): Payer: Managed Care, Other (non HMO) | Admitting: Gastroenterology

## 2012-10-21 VITALS — BP 93/62 | HR 69 | Temp 97.0°F | Resp 17 | Ht 64.0 in | Wt 158.0 lb

## 2012-10-21 DIAGNOSIS — Z1211 Encounter for screening for malignant neoplasm of colon: Secondary | ICD-10-CM

## 2012-10-21 MED ORDER — SODIUM CHLORIDE 0.9 % IV SOLN: 500.0000 mL | INTRAVENOUS | Status: DC

## 2012-10-21 NOTE — Patient Instructions (Signed)
YOU HAD AN ENDOSCOPIC PROCEDURE TODAY AT THE [facility]: Refer to the procedure report that was given to you for any specific questions about what was found during the examination.  If the procedure report does not answer your questions, please call your gastroenterologist to clarify.  If you requested that your care partner not be given the details of your procedure findings, then the procedure report has been included in a sealed envelope for you to review at your convenience later.  YOU SHOULD EXPECT: Some feelings of bloating in the abdomen. Passage of more gas than usual.  Walking can help get rid of the air that was put into your GI tract during the procedure and reduce the bloating. If you had a lower endoscopy (such as a colonoscopy or flexible sigmoidoscopy) you may notice spotting of blood in your stool or on the toilet paper. If you underwent a bowel prep for your procedure, then you may not have a normal bowel movement for a few days.  DIET: Your first meal following the procedure should be a light meal and then it is ok to progress to your normal diet.  A half-sandwich or bowl of soup is an example of a good first meal.  Heavy or fried foods are harder to digest and may make you feel nauseous or bloated.  Likewise meals heavy in dairy and vegetables can cause extra gas to form and this can also increase the bloating.  Drink plenty of fluids but you should avoid alcoholic beverages for 24 hours.  ACTIVITY: Your care partner should take you home directly after the procedure.  You should plan to take it easy, moving slowly for the rest of the day.  You can resume normal activity the day after the procedure however you should NOT DRIVE or use heavy machinery for 24 hours (because of the sedation medicines used during the test).    SYMPTOMS TO REPORT IMMEDIATELY: A gastroenterologist can be reached at any hour.  During normal business hours, 8:30 AM to 5:00 PM Monday through Friday,  call [phone].  After hours and on weekends, please call the GI answering service at [phone] who will take a message and have the physician on call contact you.   Following lower endoscopy (colonoscopy or flexible sigmoidoscopy):  Excessive amounts of blood in the stool  Significant tenderness or worsening of abdominal pains  Swelling of the abdomen that is new, acute  Fever of 100F or higher    FOLLOW UP: If any biopsies were taken you will be contacted by phone or by letter within the next 1-3 weeks.  Call your gastroenterologist if you have not heard about the biopsies in 3 weeks.  Our staff will call the home number listed on your records the next business day following your procedure to check on you and address any questions or concerns that you may have at that time regarding the information given to you following your procedure. This is a courtesy call and so if there is no answer at the home number and we have not heard from you through the emergency physician on call, we will assume that you have returned to your regular daily activities without incident.  SIGNATURES/CONFIDENTIALITY: You and/or your care partner have signed paperwork which will be entered into your electronic medical record.  These signatures attest to the fact that that the information above on your After Visit Summary has been reviewed and is understood.  Full responsibility of the confidentiality   of this discharge information lies with you and/or your care-partner.  Normal colonoscopy.  Repeat colonoscopy in 10 years-2024.

## 2012-10-21 NOTE — Op Note (Signed)
[facility] [address] [place], [address]   COLONOSCOPY PROCEDURE REPORT  PATIENT: Linda, Morrison  MR#: [id number] BIRTHDATE: 17-May-1960 , 52  yrs. old GENDER: Female ENDOSCOPIST: Mardella Layman, MD, [facility] REFERRED VH:QIONG Kim, M.D. PROCEDURE DATE:  10/21/2012 PROCEDURE:   Colonoscopy, screening First Screening Colonoscopy - Avg.  risk and is 50 yrs.  old or older - No.      History of Adenoma - Now for follow-up colonoscopy & has been > or = to 3 yrs.  N/A ASA CLASS:   Class II INDICATIONS:average risk screening. MEDICATIONS: propofol (Diprivan) 250mg  IV  DESCRIPTION OF PROCEDURE:   After the risks benefits and alternatives of the procedure were thoroughly explained, informed consent was obtained.  A digital rectal exam revealed no abnormalities of the rectum.   The [id number]  endoscope was introduced through the anus and advanced to the cecum, which was identified by both the appendix and ileocecal valve. No adverse events experienced.   The quality of the prep was excellent, using MoviPrep  The instrument was then slowly withdrawn as the colon was fully examined.      COLON FINDINGS: A normal appearing cecum, ileocecal valve, and appendiceal orifice were identified.  The ascending, hepatic flexure, transverse, splenic flexure, descending, sigmoid colon and rectum appeared unremarkable.  No polyps or cancers were seen. Retroflexed views revealed no abnormalities. The time to cecum=5 minutes 10 seconds.  Withdrawal time=6 minutes 10 seconds.  The scope was withdrawn and the procedure completed. COMPLICATIONS: There were no complications.  ENDOSCOPIC IMPRESSION: Normal colon ...no polyps noted...  RECOMMENDATIONS: 1.  Continue current medications 2.  Continue current colorectal screening recommendations for "routine risk" patients with a repeat colonoscopy in 10 years.   eSigned:  Mardella Layman, MD, [facility] 10/21/2012 10:14  AM   cc:

## 2012-10-21 NOTE — Progress Notes (Signed)
Patient did not experience any of the following events: a burn prior to discharge; a fall within the facility; wrong site/side/patient/procedure/implant event; or a hospital transfer or hospital admission upon discharge from the facility. (G8907) Patient did not have preoperative order for IV antibiotic SSI prophylaxis. (G8918)  

## 2012-10-22 ENCOUNTER — Telehealth: Payer: Self-pay | Admitting: *Deleted

## 2012-10-22 ENCOUNTER — Encounter: Payer: Managed Care, Other (non HMO) | Admitting: Gastroenterology

## 2012-10-22 NOTE — Telephone Encounter (Signed)
  Follow up Call-  Call back number 10/21/2012  Post procedure Call Back phone  # [phone]  Permission to leave phone message Yes     Patient questions:  Do you have a fever, pain , or abdominal swelling? no Pain Score  0 *  Have you tolerated food without any problems? yes  Have you been able to return to your normal activities? yes  Do you have any questions about your discharge instructions: Diet   no Medications  no Follow up visit  no  Do you have questions or concerns about your Care? no  Actions: * If pain score is 4 or above: No action needed, pain <4.

## 2012-11-09 ENCOUNTER — Encounter: Payer: Self-pay | Admitting: Gynecology

## 2012-11-11 ENCOUNTER — Encounter: Payer: Self-pay | Admitting: Podiatry

## 2012-11-11 ENCOUNTER — Ambulatory Visit (INDEPENDENT_AMBULATORY_CARE_PROVIDER_SITE_OTHER): Payer: Managed Care, Other (non HMO) | Admitting: Podiatry

## 2012-11-11 VITALS — BP 114/66 | HR 56 | Ht 64.5 in | Wt 158.0 lb

## 2012-11-11 DIAGNOSIS — L600 Ingrowing nail: Secondary | ICD-10-CM

## 2012-11-11 DIAGNOSIS — B351 Tinea unguium: Secondary | ICD-10-CM | POA: Insufficient documentation

## 2012-11-11 NOTE — Patient Instructions (Signed)
Need Ingrown nail surgery both great toes. Will do left big toe this Friday.

## 2012-11-11 NOTE — Progress Notes (Signed)
Subjective: Concerned with dry skin and color change in nails.   Objective:  Ingrown nail both great toes with both borders. Hard scaly nail borders due to excess handling.  Mild discoloration of nail plate with wedged shape on left hallux.  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  Plan: Need Ingrown nail surgery both great toes. Will do P&A left big toe this Friday.

## 2012-11-13 ENCOUNTER — Encounter: Payer: Self-pay | Admitting: Podiatry

## 2012-11-13 ENCOUNTER — Ambulatory Visit (INDEPENDENT_AMBULATORY_CARE_PROVIDER_SITE_OTHER): Payer: Managed Care, Other (non HMO) | Admitting: Podiatry

## 2012-11-13 VITALS — BP 120/82 | HR 74 | Ht 64.5 in | Wt 158.0 lb

## 2012-11-13 DIAGNOSIS — L600 Ingrowing nail: Secondary | ICD-10-CM

## 2012-11-13 DIAGNOSIS — B351 Tinea unguium: Secondary | ICD-10-CM

## 2012-11-13 MED ORDER — CEPHALEXIN 500 MG PO CAPS: 500.0000 mg | ORAL_CAPSULE | Freq: Three times a day (TID) | ORAL | Status: AC

## 2012-11-13 MED ORDER — HYDROCODONE-IBUPROFEN 5-200 MG PO TABS: 1.0000 | ORAL_TABLET | Freq: Three times a day (TID) | ORAL | Status: DC | PRN

## 2012-11-13 NOTE — Patient Instructions (Signed)
Office surgery for ingrown nail left hallux both borders. Written instruction dispensed.

## 2012-11-13 NOTE — Progress Notes (Signed)
Patient came in for scheduled ingrown nail surgery left great toe.  Assessment: Chronic ingrown nail both great toes.   Treatment: Office surgery for ingrown nail left hallux both borders done under local anesthetics. Local used with 1% Xylocaine with Epinephrine and 0.5% Marcaine plain mixed 50/50 total 5 ml. Patient tolerated well.   Written instruction dispensed.

## 2012-12-07 ENCOUNTER — Encounter: Payer: Self-pay | Admitting: Podiatry

## 2012-12-07 ENCOUNTER — Ambulatory Visit (INDEPENDENT_AMBULATORY_CARE_PROVIDER_SITE_OTHER): Payer: Managed Care, Other (non HMO) | Admitting: Podiatry

## 2012-12-07 VITALS — BP 109/71 | HR 68

## 2012-12-07 DIAGNOSIS — M79674 Pain in right toe(s): Secondary | ICD-10-CM

## 2012-12-07 DIAGNOSIS — M79609 Pain in unspecified limb: Secondary | ICD-10-CM

## 2012-12-07 DIAGNOSIS — L600 Ingrowing nail: Secondary | ICD-10-CM

## 2012-12-07 MED ORDER — HYDROCODONE-IBUPROFEN 5-200 MG PO TABS: 1.0000 | ORAL_TABLET | Freq: Three times a day (TID) | ORAL | Status: AC | PRN

## 2012-12-07 NOTE — Patient Instructions (Signed)
Ingrown nail right great toe surgery done. Soak as instructed.

## 2012-12-08 DIAGNOSIS — M79674 Pain in right toe(s): Secondary | ICD-10-CM | POA: Insufficient documentation

## 2012-12-08 NOTE — Progress Notes (Signed)
Follow up visit on left great toe nail surgery and for ingrown nail surgery on right great toe nail. Stated that she had no problems with the left toe. Took antibiotics and soaked as instructed.  Objective: Dry and healed well left great toe nail borders. Ingrown nail on both borders on right great toe.  Assessment: Satisfactory wound healing on left great toe nail following a P&A procedure both borders. Onychocryptosis right great toe nail both borders.  Plan: Phenol and Alcohol ingrown nail surgery done on right great toe on both borders. Locals used: Total 5 ml of 50/50 mixture of 0.5% Marcaine plain and 1% Xylocaine plain. Patient tolerated procedure well. Written home care instruction with dressing supply dispensed. Return this Friday for a check up before her trip to out of town.

## 2012-12-11 ENCOUNTER — Ambulatory Visit (INDEPENDENT_AMBULATORY_CARE_PROVIDER_SITE_OTHER): Payer: Managed Care, Other (non HMO) | Admitting: Podiatry

## 2012-12-11 ENCOUNTER — Encounter: Payer: Managed Care, Other (non HMO) | Admitting: Podiatry

## 2012-12-11 DIAGNOSIS — L600 Ingrowing nail: Secondary | ICD-10-CM

## 2012-12-11 NOTE — Patient Instructions (Signed)
Not seen

## 2012-12-11 NOTE — Progress Notes (Signed)
Patient had to leave. Stated that she has done well and left.

## 2013-02-09 ENCOUNTER — Telehealth: Payer: Self-pay | Admitting: *Deleted

## 2013-02-09 NOTE — Telephone Encounter (Signed)
Pt called and requests fungus medication to call in. Stated she has been following instruction of using Selsun Blue shampoo on her feet but not improving much.

## 2013-02-10 ENCOUNTER — Encounter: Payer: Self-pay | Admitting: Podiatry

## 2013-02-10 ENCOUNTER — Ambulatory Visit (INDEPENDENT_AMBULATORY_CARE_PROVIDER_SITE_OTHER): Payer: Managed Care, Other (non HMO) | Admitting: Podiatry

## 2013-02-10 DIAGNOSIS — B351 Tinea unguium: Secondary | ICD-10-CM

## 2013-02-10 MED ORDER — EFINACONAZOLE 10 % EX SOLN: 1.0000 "application " | Freq: Every morning | CUTANEOUS | Status: AC

## 2013-02-10 NOTE — Patient Instructions (Signed)
Seen to get Rx for fungal nail. Jublia prescribed.

## 2013-02-10 NOTE — Progress Notes (Signed)
Patient came in to get prescription for antifungal medication. Has light fungal infection on all nails. Rx Jublia sent. Return as needed.

## 2017-11-14 ENCOUNTER — Other Ambulatory Visit: Payer: Self-pay

## 2022-07-17 ENCOUNTER — Institutional Professional Consult (permissible substitution): Payer: Managed Care, Other (non HMO) | Admitting: Plastic Surgery

## 2022-07-24 ENCOUNTER — Institutional Professional Consult (permissible substitution): Payer: Managed Care, Other (non HMO) | Admitting: Plastic Surgery

## 2022-08-13 ENCOUNTER — Ambulatory Visit (INDEPENDENT_AMBULATORY_CARE_PROVIDER_SITE_OTHER): Payer: Self-pay | Admitting: Plastic Surgery

## 2022-08-13 ENCOUNTER — Encounter: Payer: Self-pay | Admitting: Plastic Surgery

## 2022-08-13 VITALS — BP 126/67 | HR 63 | Ht 65.0 in | Wt 157.6 lb

## 2022-08-13 DIAGNOSIS — Z719 Counseling, unspecified: Secondary | ICD-10-CM

## 2022-08-13 NOTE — Progress Notes (Signed)
Patient ID: Linda Morrison, female    DOB: 07-27-1960, 62 y.o.   MRN: [id number]   Chief Complaint  Patient presents with   Consult    The patient is a 62 year old female that looks younger than her stated age.  She is quite active.  She is 5 feet 5 inches tall weighs 157 pounds.  She is interested in facial rejuvenation.  She has some mild volume loss in her midface.  She has some mild nasolabial folds and jowls.  The main thing that stands out to me is the sun damage on her skin on her face.  She has a cycle or so she has been in the sun for many years.  She also does not like her nasal tip and is interested in a rhinoplasty as well as a blepharoplasty.    Review of Systems  Constitutional: Negative.   HENT: Negative.    Eyes: Negative.   Respiratory: Negative.    Cardiovascular: Negative.   Gastrointestinal: Negative.   Endocrine: Negative.   Genitourinary: Negative.   Musculoskeletal: Negative.     Past Medical History:  Diagnosis Date   Anxiety    Depression     Past Surgical History:  Procedure Laterality Date   BUNIONECTOMY Right 2008   COLOSTOMY  2008   after perforation colon   COLOSTOMY REVERSAL  2008      Current Outpatient Medications:    Efinaconazole (JUBLIA) 10 % SOLN, Apply 1 application topically every morning., Disp: 1 Bottle, Rfl: 6   sertraline (ZOLOFT) 50 MG tablet, Take 50 mg by mouth as needed., Disp: , Rfl:    cephALEXin (KEFLEX) 500 MG capsule, Take 1 capsule (500 mg total) by mouth 3 (three) times daily., Disp: 30 capsule, Rfl: 1   hydrocodone-ibuprofen (VICOPROFEN) 5-200 MG per tablet, Take 1 tablet by mouth every 8 (eight) hours as needed for pain., Disp: 60 tablet, Rfl: 0   meloxicam (MOBIC) 15 MG tablet, Take 15 mg by mouth daily. , Disp: , Rfl:    metaxalone (SKELAXIN) 800 MG tablet, Take 800 mg by mouth daily. , Disp: , Rfl:    MOVIPREP 100 G SOLR, , Disp: , Rfl:    Objective:   Vitals:   08/13/22 1517  BP: 126/67  Pulse: 63   SpO2: 98%    Physical Exam Vitals and nursing note reviewed.  Constitutional:      Appearance: Normal appearance.  HENT:     Head: Normocephalic and atraumatic.  Cardiovascular:     Rate and Rhythm: Normal rate.     Pulses: Normal pulses.  Skin:    General: Skin is warm.     Capillary Refill: Capillary refill takes less than 2 seconds.  Neurological:     Mental Status: She is alert and oriented to person, place, and time.  Psychiatric:        Mood and Affect: Mood normal.        Behavior: Behavior normal.        Thought Content: Thought content normal.        Judgment: Judgment normal.     Assessment & Plan:  Encounter for counseling  He had a very nice discussion about facial rejuvenation getting ready for a facelift.  I am going to set her up with [facility] for a consult.  I think she would do very well with Moxi laser treatment.  She could do a blepharoplasty at any time.  The facelift would likely be down the  road maybe a year as well as in the rhinoplasty.  Pictures were obtained of the patient and placed in the chart with the patient's or guardian's permission.   Alena Bills Wendy Mikles, DO

## 2022-08-19 ENCOUNTER — Ambulatory Visit (INDEPENDENT_AMBULATORY_CARE_PROVIDER_SITE_OTHER): Payer: Self-pay

## 2022-08-19 DIAGNOSIS — Z719 Counseling, unspecified: Secondary | ICD-10-CM

## 2022-08-28 ENCOUNTER — Encounter: Payer: Self-pay | Admitting: *Deleted
